# Patient Record
Sex: Male | Born: 2000 | Race: Black or African American | Hispanic: No | Marital: Single | Smoking: Never smoker
Health system: Southern US, Community
[De-identification: ages and names within clinical notes are randomized; demographics above are authoritative.]

## PROBLEM LIST (undated history)

## (undated) ENCOUNTER — Emergency Department (HOSPITAL_COMMUNITY): Admission: EM

## (undated) DIAGNOSIS — J45909 Unspecified asthma, uncomplicated: Secondary | ICD-10-CM

## (undated) HISTORY — PX: HAND TENDON SURGERY: SHX663

---

## 2019-10-16 ENCOUNTER — Other Ambulatory Visit: Payer: Self-pay

## 2019-10-16 ENCOUNTER — Emergency Department (HOSPITAL_COMMUNITY)
Admission: EM | Admit: 2019-10-16 | Discharge: 2019-10-16 | Disposition: A | Discharge status: Home / Self Care | Payer: Self-pay | Attending: Emergency Medicine | Admitting: Emergency Medicine

## 2019-10-16 ENCOUNTER — Emergency Department (HOSPITAL_COMMUNITY): Payer: Self-pay

## 2019-10-16 DIAGNOSIS — Y92019 Unspecified place in single-family (private) house as the place of occurrence of the external cause: Secondary | ICD-10-CM | POA: Insufficient documentation

## 2019-10-16 DIAGNOSIS — Z23 Encounter for immunization: Secondary | ICD-10-CM | POA: Insufficient documentation

## 2019-10-16 DIAGNOSIS — S61421A Laceration with foreign body of right hand, initial encounter: Secondary | ICD-10-CM | POA: Insufficient documentation

## 2019-10-16 DIAGNOSIS — S61512A Laceration without foreign body of left wrist, initial encounter: Secondary | ICD-10-CM | POA: Insufficient documentation

## 2019-10-16 DIAGNOSIS — Y999 Unspecified external cause status: Secondary | ICD-10-CM | POA: Insufficient documentation

## 2019-10-16 DIAGNOSIS — W25XXXA Contact with sharp glass, initial encounter: Secondary | ICD-10-CM | POA: Insufficient documentation

## 2019-10-16 DIAGNOSIS — Y9389 Activity, other specified: Secondary | ICD-10-CM | POA: Insufficient documentation

## 2019-10-16 MED ORDER — TETANUS-DIPHTH-ACELL PERTUSSIS 5-2.5-18.5 LF-MCG/0.5 IM SUSP
0.5000 mL | Freq: Once | INTRAMUSCULAR | Status: AC
  Administered 2019-10-16: 0.5 mL via INTRAMUSCULAR
  Filled 2019-10-16: qty 0.5

## 2019-10-16 MED ORDER — LIDOCAINE HCL (PF) 1 % IJ SOLN
5.0000 mL | Freq: Once | INTRAMUSCULAR | Status: AC
  Administered 2019-10-16: 5 mL via INTRADERMAL
  Filled 2019-10-16: qty 30

## 2019-10-16 NOTE — Discharge Instructions (Signed)
You can take tylenol or motrin for pain at home. You need to keep wound clean with soap and warm water daily. Follow-up with your primary care doctor in 7-10 days for suture removal. I have listed number for hand specialist if any issues arise. Return here for any new/acute changes.
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
## 2019-10-16 NOTE — ED Notes (Addendum)
Pt angry that he is not getting pain meds. Provider made aware but declined pain meds d/t intoxication. Pt and family member angry and yelling in hallway. Charge RN at bedside to assess situation and wheeled pt out of ED while this RN in another patient's room. Charge RN unsure if pt had IV in arm at that time. Heplock not found in room. No answer when phone number listed in chart called.

## 2019-10-16 NOTE — ED Notes (Addendum)
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
## 2020-01-20 ENCOUNTER — Other Ambulatory Visit: Payer: Self-pay

## 2020-01-20 ENCOUNTER — Encounter (HOSPITAL_COMMUNITY): Payer: Self-pay

## 2020-01-20 ENCOUNTER — Ambulatory Visit (HOSPITAL_COMMUNITY)
Admission: EM | Admit: 2020-01-20 | Discharge: 2020-01-20 | Disposition: A | Discharge status: Home / Self Care | Payer: Self-pay | Attending: Family Medicine | Admitting: Family Medicine

## 2020-01-20 DIAGNOSIS — Z202 Contact with and (suspected) exposure to infections with a predominantly sexual mode of transmission: Secondary | ICD-10-CM | POA: Insufficient documentation

## 2020-01-20 DIAGNOSIS — N485 Ulcer of penis: Secondary | ICD-10-CM | POA: Insufficient documentation

## 2020-01-20 LAB — HIV ANTIBODY (ROUTINE TESTING W REFLEX): HIV Screen 4th Generation wRfx: NONREACTIVE

## 2020-01-20 MED ORDER — PENICILLIN G BENZATHINE 1200000 UNIT/2ML IM SUSP
INTRAMUSCULAR | Status: AC
  Filled 2020-01-20: qty 4

## 2020-01-20 MED ORDER — PENICILLIN G BENZATHINE 1200000 UNIT/2ML IM SUSP
2.4000 10*6.[IU] | Freq: Once | INTRAMUSCULAR | Status: AC
  Administered 2020-01-20: 2.4 10*6.[IU] via INTRAMUSCULAR

## 2020-01-20 NOTE — ED Triage Notes (Signed)
Pt presents with swollen groin. Pt states that he had a STD infection around September. Pt states he feels the infection did not go away completely. He states that he has only been sexually active with the partner that was also treated for Syphilis. Pt denies penile discharge and pain. Pt states he completed the tx.

## 2020-01-20 NOTE — Discharge Instructions (Addendum)
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

## 2020-01-20 NOTE — ED Provider Notes (Signed)
[facility]    CSN: [id number] Arrival date & time: 01/20/20  1545      History   Chief Complaint Chief Complaint  Patient presents with   Exposure to STD   Groin Swelling    HPI Carl Cobb is a 19 y.o. male.   HPI   Patient states that he had syphilis 2 months ago in September. He was treated with a shot of penicillin. He states he only has 1 sex partner. His sex partner told him that he was treated as well. He had complete resolution of the ulcer on his penis and the wound clinic. Over the last week, he has noticed the ulcers come back and so has the swollen gland. He has concern that the infection has come back.  No past medical history on file.  There are no problems to display for this patient.     Home Medications    Prior to Admission medications   Not on File    Family History No family history on file.  Social History Social History   Tobacco Use   Smoking status: Not on file  Substance Use Topics   Alcohol use: Not on file   Drug use: Not on file     Allergies   Patient has no allergy information on record.   Review of Systems Review of Systems See HPI  Physical Exam Triage Vital Signs ED Triage Vitals  Enc Vitals Group     BP 01/20/20 1648 114/65     Pulse Rate 01/20/20 1648 73     Resp 01/20/20 1648 16     Temp 01/20/20 1648 97.9 F (36.6 C)     Temp Source 01/20/20 1648 Oral     SpO2 01/20/20 1648 98 %     Weight --      Height --      Head Circumference --      Peak Flow --      Pain Score 01/20/20 1644 2     Pain Loc --      Pain Edu? --      Excl. in GC? --    No data found.  Updated Vital Signs BP 114/65 (BP Location: Right Arm)    Pulse 73    Temp 97.9 F (36.6 C) (Oral)    Resp 16    SpO2 98%     Physical Exam Constitutional:      General: He is not in acute distress.    Appearance: He is well-developed.  HENT:     Head: Normocephalic and atraumatic.  Eyes:     Conjunctiva/sclera:  Conjunctivae normal.     Pupils: Pupils are equal, round, and reactive to light.  Cardiovascular:     Rate and Rhythm: Normal rate.  Pulmonary:     Effort: Pulmonary effort is normal. No respiratory distress.  Abdominal:     General: There is no distension.     Palpations: Abdomen is soft.  Genitourinary:    Comments: 12 mm shallow ulcer on shaft of penis, nontender. Inguinal adenopathy left groin, moderately tender Musculoskeletal:        General: Normal range of motion.     Cervical back: Normal range of motion.  Skin:    General: Skin is warm and dry.  Neurological:     General: No focal deficit present.     Mental Status: He is alert.  Psychiatric:        Behavior: Behavior normal.  UC Treatments / Results  Labs (all labs ordered are listed, but only abnormal results are displayed) Labs Reviewed  RPR  HIV ANTIBODY (ROUTINE TESTING W REFLEX)  CYTOLOGY, (ORAL, ANAL, URETHRAL) ANCILLARY ONLY    EKG   Radiology No results found.  Procedures Procedures (including critical care time)  Medications Ordered in UC Medications  penicillin g benzathine (BICILLIN LA) 1200000 UNIT/2ML injection 2.4 Million Units (has no administration in time range)    Initial Impression / Assessment and Plan / UC Course  I have reviewed the triage vital signs and the nursing notes.  Pertinent labs & imaging results that were available during my care of the patient were reviewed by me and considered in my medical decision making (see chart for details).    Final Clinical Impressions(s) / UC Diagnoses   Final diagnoses:  STD exposure  Ulcer of penis     Discharge Instructions     Check for your results on My Chart You have been tested for syphilis, HIV, Gonorrhea, chlamydia, and trichomonas You were treated for syphilis Avoid sexual relations for 7 days   ED Prescriptions    None     PDMP not reviewed this encounter.   Eustace Moore, MD 01/20/20 [phone]

## 2020-01-21 LAB — RPR
RPR Ser Ql: REACTIVE — AB
RPR Titer: 1:2 {titer}

## 2020-01-22 LAB — CYTOLOGY, (ORAL, ANAL, URETHRAL) ANCILLARY ONLY
Chlamydia: NEGATIVE
Comment: NEGATIVE
Comment: NEGATIVE
Comment: NORMAL
Neisseria Gonorrhea: NEGATIVE
Trichomonas: NEGATIVE

## 2020-01-22 LAB — T.PALLIDUM AB, TOTAL: T Pallidum Abs: REACTIVE — AB

## 2021-08-28 ENCOUNTER — Encounter (HOSPITAL_COMMUNITY): Payer: Self-pay | Admitting: *Deleted

## 2021-08-28 ENCOUNTER — Ambulatory Visit (INDEPENDENT_AMBULATORY_CARE_PROVIDER_SITE_OTHER): Payer: BC Managed Care – PPO

## 2021-08-28 ENCOUNTER — Other Ambulatory Visit: Payer: Self-pay

## 2021-08-28 ENCOUNTER — Ambulatory Visit (HOSPITAL_COMMUNITY)
Admission: EM | Admit: 2021-08-28 | Discharge: 2021-08-28 | Disposition: A | Discharge status: Home / Self Care | Payer: BC Managed Care – PPO | Attending: Physician Assistant | Admitting: Physician Assistant

## 2021-08-28 DIAGNOSIS — S61212A Laceration without foreign body of right middle finger without damage to nail, initial encounter: Secondary | ICD-10-CM | POA: Diagnosis not present

## 2021-08-28 DIAGNOSIS — T07XXXA Unspecified multiple injuries, initial encounter: Secondary | ICD-10-CM

## 2021-08-28 DIAGNOSIS — S6991XA Unspecified injury of right wrist, hand and finger(s), initial encounter: Secondary | ICD-10-CM

## 2021-08-28 DIAGNOSIS — S61214A Laceration without foreign body of right ring finger without damage to nail, initial encounter: Secondary | ICD-10-CM

## 2021-08-28 MED ORDER — LIDOCAINE HCL (PF) 1 % IJ SOLN
INTRAMUSCULAR | Status: AC
  Filled 2021-08-28: qty 30

## 2021-08-28 MED ORDER — TETANUS-DIPHTH-ACELL PERTUSSIS 5-2.5-18.5 LF-MCG/0.5 IM SUSY
PREFILLED_SYRINGE | INTRAMUSCULAR | Status: AC
  Filled 2021-08-28: qty 0.5

## 2021-08-28 MED ORDER — TETANUS-DIPHTH-ACELL PERTUSSIS 5-2.5-18.5 LF-MCG/0.5 IM SUSY
0.5000 mL | PREFILLED_SYRINGE | Freq: Once | INTRAMUSCULAR | Status: AC
  Administered 2021-08-28: 0.5 mL via INTRAMUSCULAR

## 2021-08-28 NOTE — ED Notes (Signed)
Pressure dsy applied to Rt middle finger

## 2021-08-28 NOTE — Discharge Instructions (Signed)
Keep area clean.  Keep the pressure dressing on overnight.  If when you remove this you continue to have bleeding you need to be reevaluated.  As we discussed you should moisten the bandage before removing it to prevent removing the clot.  If anything worsens you need to be reevaluated.  We updated your tetanus today.  Assuming that area heals appropriately you will need to have sutures taken out of your fourth finger in approximately 10 to 14 days.  If you develop any signs of infection including swelling, drainage, fever, increased pain you need to be seen immediately.
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
## 2021-10-04 IMAGING — DX DG HAND COMPLETE 3+V*R*
3 series · 3 of 3 positions shown · non-contrast
Comparison: None.

CLINICAL DATA: Posttraumatic hand pain

EXAM:
RIGHT HAND - COMPLETE 3+ VIEW

[hand ap]
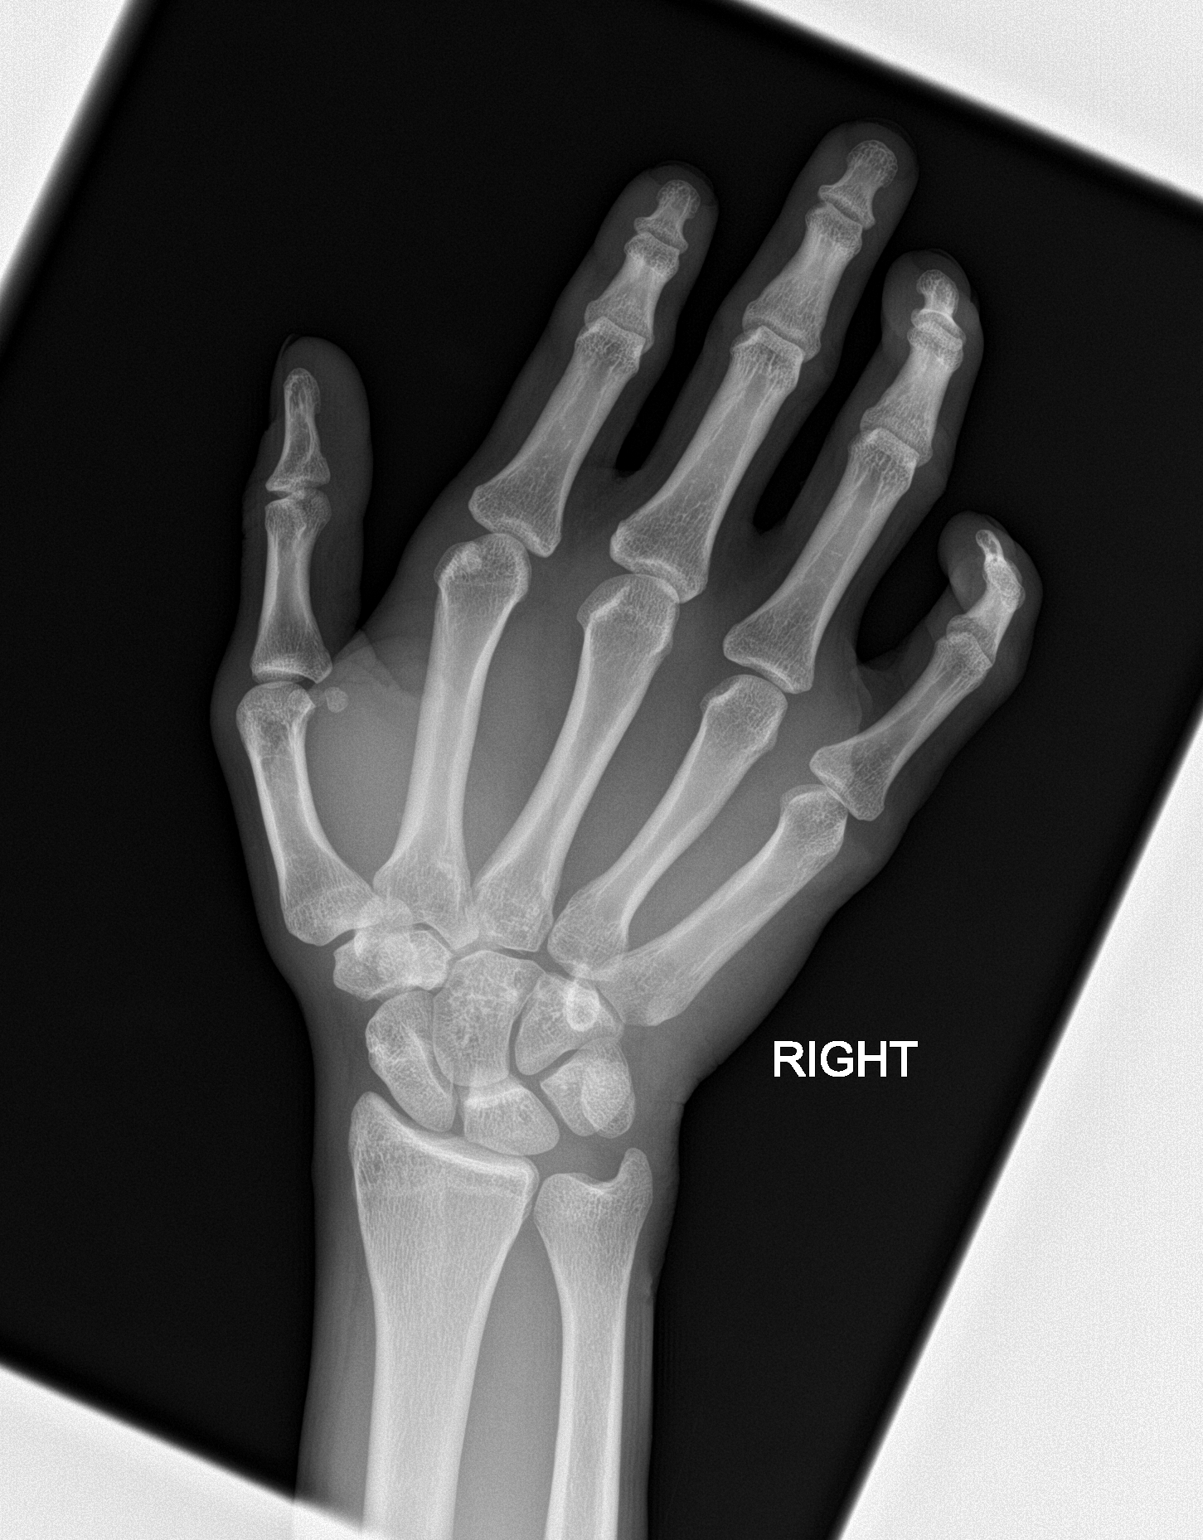

[hand obl]
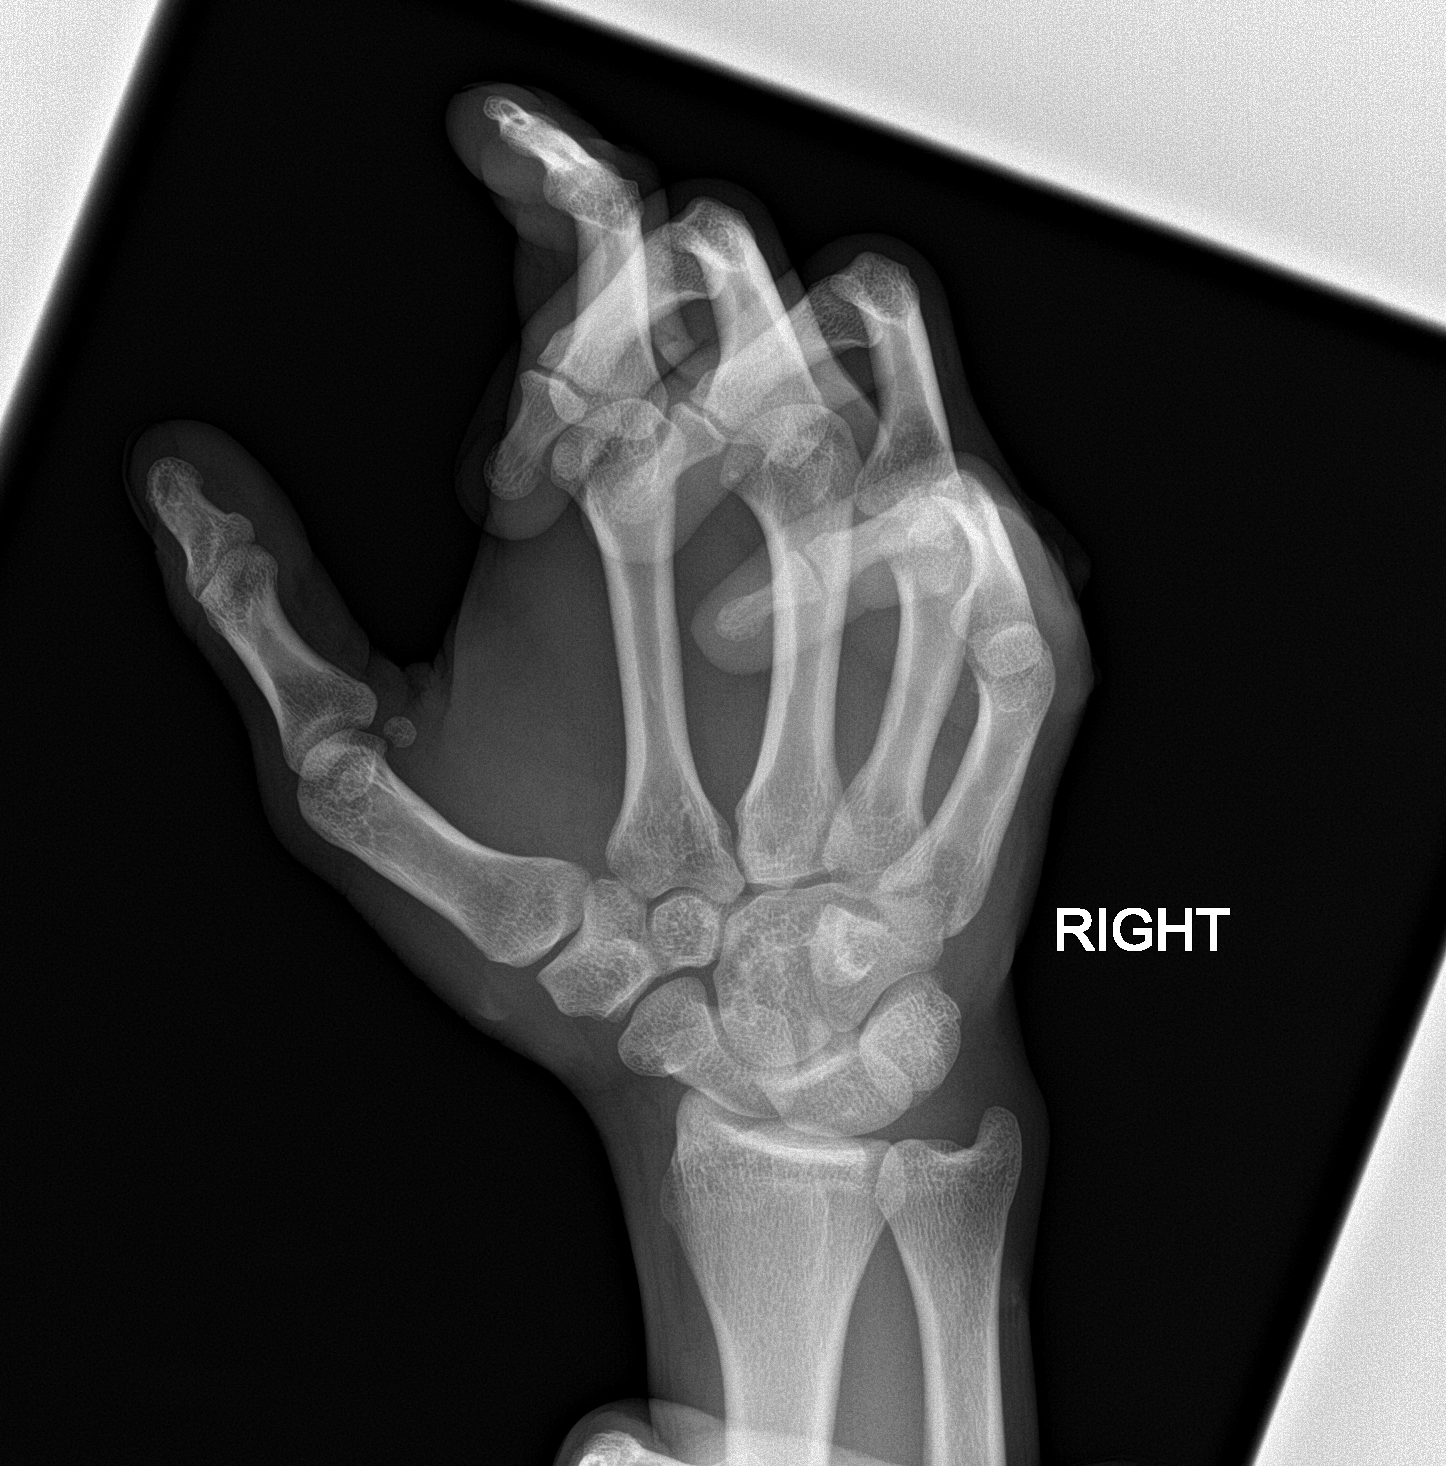

[hand lat]
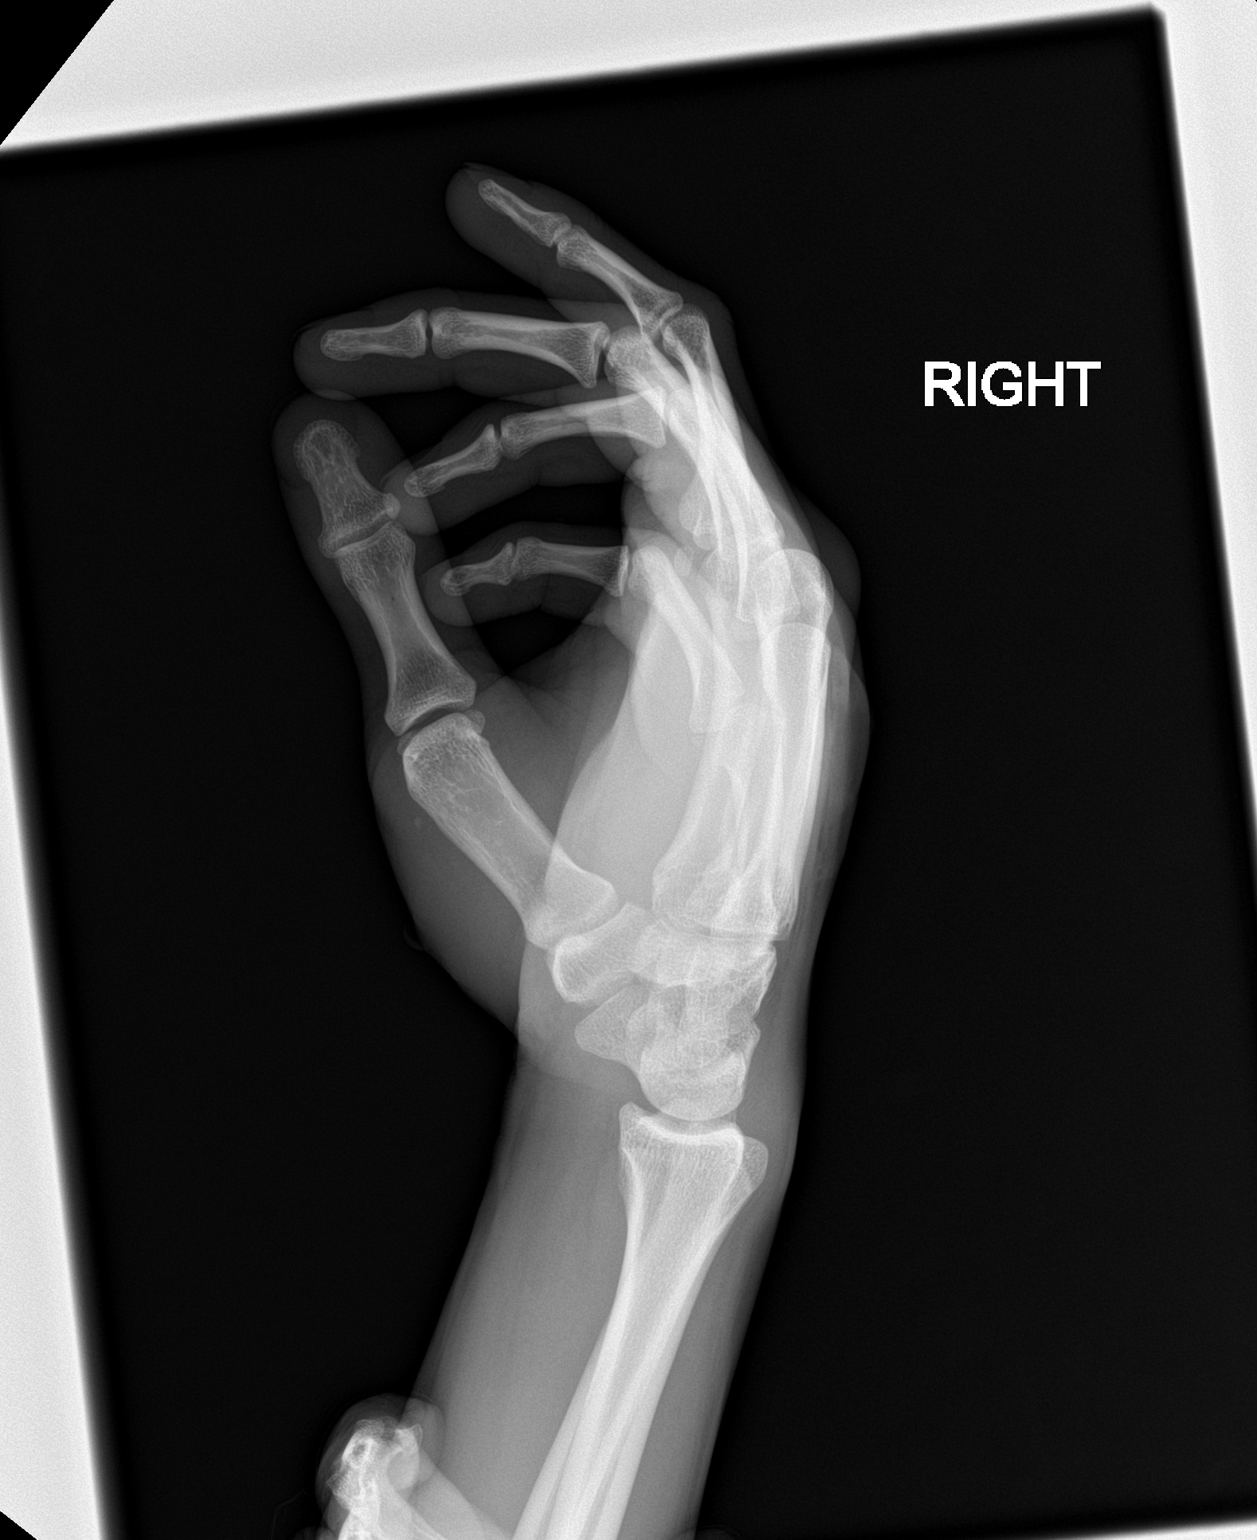

[3 of 3 positions shown; findings below may reference images not displayed]

FINDINGS: There is no evidence of fracture or dislocation.

2 mm density near the first metacarpal head only seen on the lateral
view, possibly obscured on the frontal views.
IMPRESSION: 1. Negative for fracture.
2. Equivocal for 2 mm foreign body near the first MCP, see lateral
view.

## 2023-01-10 ENCOUNTER — Emergency Department (HOSPITAL_COMMUNITY)
Admission: EM | Admit: 2023-01-10 | Discharge: 2023-01-11 | Disposition: A | Discharge status: Home / Self Care | Payer: BC Managed Care – PPO | Attending: Emergency Medicine | Admitting: Emergency Medicine

## 2023-01-10 ENCOUNTER — Other Ambulatory Visit: Payer: Self-pay

## 2023-01-10 ENCOUNTER — Emergency Department (HOSPITAL_COMMUNITY): Payer: BC Managed Care – PPO

## 2023-01-10 ENCOUNTER — Encounter (HOSPITAL_COMMUNITY): Payer: Self-pay

## 2023-01-10 DIAGNOSIS — S0101XA Laceration without foreign body of scalp, initial encounter: Secondary | ICD-10-CM | POA: Insufficient documentation

## 2023-01-10 MED ORDER — LIDOCAINE-EPINEPHRINE-TETRACAINE (LET) TOPICAL GEL
3.0000 mL | Freq: Once | TOPICAL | Status: AC
Start: 2023-01-10 — End: 2023-01-10
  Administered 2023-01-10: 3 mL via TOPICAL
  Filled 2023-01-10: qty 3
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
## 2023-01-11 NOTE — Discharge Instructions (Signed)
Your CT scans were reassuring.  The staples will need to be removed in approximately 7 to 10 days.  You may go to any healthcare provider for removal including primary care, urgent care or, or the emergency department.  If you develop any emergent symptoms return to the emergency department.

## 2023-08-12 ENCOUNTER — Emergency Department (HOSPITAL_COMMUNITY)
Admission: EM | Admit: 2023-08-12 | Discharge: 2023-08-12 | Discharge status: Against Medical Advice | Attending: Emergency Medicine | Admitting: Emergency Medicine

## 2023-08-12 ENCOUNTER — Other Ambulatory Visit: Payer: Self-pay

## 2023-08-12 ENCOUNTER — Emergency Department (HOSPITAL_COMMUNITY)

## 2023-08-12 ENCOUNTER — Emergency Department (HOSPITAL_COMMUNITY)
Admission: EM | Admit: 2023-08-12 | Discharge: 2023-08-13 | Disposition: A | Discharge status: Home / Self Care | Attending: Emergency Medicine | Admitting: Emergency Medicine

## 2023-08-12 ENCOUNTER — Encounter (HOSPITAL_COMMUNITY): Payer: Self-pay

## 2023-08-12 DIAGNOSIS — Y92410 Unspecified street and highway as the place of occurrence of the external cause: Secondary | ICD-10-CM | POA: Insufficient documentation

## 2023-08-12 DIAGNOSIS — F419 Anxiety disorder, unspecified: Secondary | ICD-10-CM | POA: Insufficient documentation

## 2023-08-12 DIAGNOSIS — T796XXA Traumatic ischemia of muscle, initial encounter: Secondary | ICD-10-CM | POA: Insufficient documentation

## 2023-08-12 DIAGNOSIS — Y908 Blood alcohol level of 240 mg/100 ml or more: Secondary | ICD-10-CM | POA: Diagnosis not present

## 2023-08-12 DIAGNOSIS — E876 Hypokalemia: Secondary | ICD-10-CM | POA: Insufficient documentation

## 2023-08-12 DIAGNOSIS — F10920 Alcohol use, unspecified with intoxication, uncomplicated: Secondary | ICD-10-CM | POA: Insufficient documentation

## 2023-08-12 DIAGNOSIS — S3991XA Unspecified injury of abdomen, initial encounter: Secondary | ICD-10-CM | POA: Diagnosis present

## 2023-08-12 DIAGNOSIS — R4182 Altered mental status, unspecified: Secondary | ICD-10-CM | POA: Diagnosis present

## 2023-08-12 HISTORY — DX: Unspecified asthma, uncomplicated: J45.909

## 2023-08-12 LAB — CBC WITH DIFFERENTIAL/PLATELET
Abs Immature Granulocytes: 0.02 10*3/uL (ref 0.00–0.07)
Basophils Absolute: 0 10*3/uL (ref 0.0–0.1)
Basophils Relative: 1 %
Eosinophils Absolute: 0 10*3/uL (ref 0.0–0.5)
Eosinophils Relative: 0 %
HCT: 43.9 % (ref 39.0–52.0)
Hemoglobin: 14.6 g/dL (ref 13.0–17.0)
Immature Granulocytes: 0 %
Lymphocytes Relative: 12 %
Lymphs Abs: 0.9 10*3/uL (ref 0.7–4.0)
MCH: 29.9 pg (ref 26.0–34.0)
MCHC: 33.3 g/dL (ref 30.0–36.0)
MCV: 90 fL (ref 80.0–100.0)
Monocytes Absolute: 0.8 10*3/uL (ref 0.1–1.0)
Monocytes Relative: 10 %
Neutro Abs: 5.8 10*3/uL (ref 1.7–7.7)
Neutrophils Relative %: 77 %
Platelets: 230 10*3/uL (ref 150–400)
RBC: 4.88 MIL/uL (ref 4.22–5.81)
RDW: 14.5 % (ref 11.5–15.5)
WBC: 7.5 10*3/uL (ref 4.0–10.5)
nRBC: 0 % (ref 0.0–0.2)

## 2023-08-12 LAB — URINALYSIS, W/ REFLEX TO CULTURE (INFECTION SUSPECTED)
Bacteria, UA: NONE SEEN
Bilirubin Urine: NEGATIVE
Glucose, UA: NEGATIVE mg/dL
Ketones, ur: 20 mg/dL — AB
Leukocytes,Ua: NEGATIVE
Nitrite: NEGATIVE
Protein, ur: NEGATIVE mg/dL
Specific Gravity, Urine: >1.046 — ABNORMAL HIGH (ref 1.005–1.030)
pH: 5 (ref 5.0–8.0)

## 2023-08-12 LAB — SALICYLATE LEVEL: Salicylate Lvl: <7 mg/dL — ABNORMAL LOW (ref 7.0–30.0)

## 2023-08-12 LAB — CBC
HCT: 42.7 % (ref 39.0–52.0)
Hemoglobin: 14.6 g/dL (ref 13.0–17.0)
MCH: 30.4 pg (ref 26.0–34.0)
MCHC: 34.2 g/dL (ref 30.0–36.0)
MCV: 88.8 fL (ref 80.0–100.0)
Platelets: 229 10*3/uL (ref 150–400)
RBC: 4.81 MIL/uL (ref 4.22–5.81)
RDW: 14.1 % (ref 11.5–15.5)
WBC: 6.8 10*3/uL (ref 4.0–10.5)
nRBC: 0 % (ref 0.0–0.2)

## 2023-08-12 LAB — COMPREHENSIVE METABOLIC PANEL WITH GFR
ALT: 98 U/L — ABNORMAL HIGH (ref 0–44)
AST: 127 U/L — ABNORMAL HIGH (ref 15–41)
Albumin: 4.9 g/dL (ref 3.5–5.0)
Alkaline Phosphatase: 107 U/L (ref 38–126)
Anion gap: 18 — ABNORMAL HIGH (ref 5–15)
BUN: 13 mg/dL (ref 6–20)
CO2: 19 mmol/L — ABNORMAL LOW (ref 22–32)
Calcium: 8.8 mg/dL — ABNORMAL LOW (ref 8.9–10.3)
Chloride: 99 mmol/L (ref 98–111)
Creatinine, Ser: 0.77 mg/dL (ref 0.61–1.24)
GFR, Estimated: >60 mL/min (ref >60)
Glucose, Bld: 83 mg/dL (ref 70–99)
Potassium: 3.1 mmol/L — ABNORMAL LOW (ref 3.5–5.1)
Sodium: 136 mmol/L (ref 135–145)
Total Bilirubin: 1.3 mg/dL — ABNORMAL HIGH (ref 0.0–1.2)
Total Protein: 7.9 g/dL (ref 6.5–8.1)

## 2023-08-12 LAB — BASIC METABOLIC PANEL WITH GFR
Anion gap: 14 (ref 5–15)
BUN: 17 mg/dL (ref 6–20)
CO2: 19 mmol/L — ABNORMAL LOW (ref 22–32)
Calcium: 9.1 mg/dL (ref 8.9–10.3)
Chloride: 107 mmol/L (ref 98–111)
Creatinine, Ser: 1.08 mg/dL (ref 0.61–1.24)
GFR, Estimated: >60 mL/min (ref >60)
Glucose, Bld: 99 mg/dL (ref 70–99)
Potassium: 3.3 mmol/L — ABNORMAL LOW (ref 3.5–5.1)
Sodium: 140 mmol/L (ref 135–145)

## 2023-08-12 LAB — ACETAMINOPHEN LEVEL: Acetaminophen (Tylenol), Serum: <10 ug/mL — ABNORMAL LOW (ref 10–30)

## 2023-08-12 LAB — CK: Total CK: 3184 U/L — ABNORMAL HIGH (ref 49–397)

## 2023-08-12 LAB — ETHANOL: Alcohol, Ethyl (B): 413 mg/dL (ref <15)

## 2023-08-12 MED ORDER — LORAZEPAM 2 MG/ML IJ SOLN
1.0000 mg | Freq: Once | INTRAMUSCULAR | Status: AC
Start: 2023-08-12 — End: 2023-08-12
  Administered 2023-08-12: 1 mg via INTRAVENOUS
  Filled 2023-08-12: qty 1

## 2023-08-12 MED ORDER — HALOPERIDOL LACTATE 5 MG/ML IJ SOLN
INTRAMUSCULAR | Status: AC
  Administered 2023-08-12: 5 mg
  Filled 2023-08-12: qty 1

## 2023-08-12 MED ORDER — IOHEXOL 300 MG/ML  SOLN
100.0000 mL | Freq: Once | INTRAMUSCULAR | Status: AC | PRN
  Administered 2023-08-12: 100 mL via INTRAVENOUS

## 2023-08-12 MED ORDER — LACTATED RINGERS IV BOLUS
2000.0000 mL | Freq: Once | INTRAVENOUS | Status: AC
  Administered 2023-08-12: 2000 mL via INTRAVENOUS

## 2023-08-12 MED ORDER — LORAZEPAM 2 MG/ML IJ SOLN
INTRAMUSCULAR | Status: AC
  Filled 2023-08-12: qty 1

## 2023-08-12 NOTE — ED Notes (Signed)
 Pt getting very groggy, RN convinced him to get into the bed so that he did not fall down.
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
## 2023-08-12 NOTE — ED Notes (Signed)
 Pt is starting to calm down finally sitting in the chair.
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
## 2023-08-12 NOTE — ED Notes (Signed)
 Patient wallet is here at nurse secretary desk color baby blue . Please leave at desk until pt is discharge from [facility]
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
## 2023-08-13 LAB — COMPREHENSIVE METABOLIC PANEL WITH GFR
ALT: 81 U/L — ABNORMAL HIGH (ref 0–44)
AST: 103 U/L — ABNORMAL HIGH (ref 15–41)
Albumin: 4.2 g/dL (ref 3.5–5.0)
Alkaline Phosphatase: 92 U/L (ref 38–126)
Anion gap: 10 (ref 5–15)
BUN: 11 mg/dL (ref 6–20)
CO2: 24 mmol/L (ref 22–32)
Calcium: 8.7 mg/dL — ABNORMAL LOW (ref 8.9–10.3)
Chloride: 102 mmol/L (ref 98–111)
Creatinine, Ser: 0.87 mg/dL (ref 0.61–1.24)
GFR, Estimated: >60 mL/min (ref >60)
Glucose, Bld: 86 mg/dL (ref 70–99)
Potassium: 3.4 mmol/L — ABNORMAL LOW (ref 3.5–5.1)
Sodium: 136 mmol/L (ref 135–145)
Total Bilirubin: 1.6 mg/dL — ABNORMAL HIGH (ref 0.0–1.2)
Total Protein: 6.8 g/dL (ref 6.5–8.1)

## 2023-08-13 LAB — CK: Total CK: 2725 U/L — ABNORMAL HIGH (ref 49–397)

## 2023-08-13 MED ORDER — POTASSIUM CHLORIDE CRYS ER 20 MEQ PO TBCR: 20.0000 meq | EXTENDED_RELEASE_TABLET | Freq: Two times a day (BID) | ORAL | 0 refills | Status: AC

## 2023-08-13 MED ORDER — POTASSIUM CHLORIDE CRYS ER 20 MEQ PO TBCR
40.0000 meq | EXTENDED_RELEASE_TABLET | Freq: Once | ORAL | Status: AC
  Administered 2023-08-13: 40 meq via ORAL
  Filled 2023-08-13: qty 2

## 2023-08-13 NOTE — ED Provider Notes (Signed)
  Physical Exam  BP 134/86 (BP Location: Left Arm)   Pulse 78   Temp 98.6 F (37 C) (Oral)   Resp 18   Ht 6' 2 (1.88 m)   Wt 68 kg   SpO2 98%   BMI 19.26 kg/m   Physical Exam Vitals and nursing note reviewed.  Constitutional:      General: He is not in acute distress.    Appearance: He is not toxic-appearing.  HENT:     Head: Normocephalic and atraumatic.  Pulmonary:     Effort: No respiratory distress.   Skin:    Coloration: Skin is not jaundiced or pale.   Neurological:     Mental Status: He is alert and oriented to person, place, and time.   Psychiatric:        Behavior: Behavior normal.     Procedures  Procedures  ED Course / MDM    Medical Decision Making Amount and/or Complexity of Data Reviewed Labs: ordered. Radiology: ordered.  Risk Prescription drug management.   23 year old male signed out to me at shift change.  Please see the previous provider note for further details.  In short, 23 year old male signed out to me pending repeat CK level.  Per previous provider plan as discussed with Dr. Dasie, her attending, the patient will be sent home as long as his CK is downtrending.  Update: Patient CK has down trended to 2725.  Patient has received 2 L of LR at this time.  Patient will be discharged home with potassium repletion and advised to follow-up with PCP.  Stable to discharge.       Ruthell Lonni FALCON, PA-C 08/13/23 0113    Palumbo, April, MD 08/13/23 [phone]

## 2023-08-14 ENCOUNTER — Encounter (HOSPITAL_COMMUNITY): Payer: Self-pay
